# Patient Record
Sex: Male | Born: 1985 | Race: Black or African American | Hispanic: No | Marital: Single | Smoking: Former smoker
Health system: Southern US, Community
[De-identification: ages and names within clinical notes are randomized; demographics above are authoritative.]

## PROBLEM LIST (undated history)

## (undated) DIAGNOSIS — I10 Essential (primary) hypertension: Secondary | ICD-10-CM

## (undated) DIAGNOSIS — T7840XA Allergy, unspecified, initial encounter: Secondary | ICD-10-CM

## (undated) DIAGNOSIS — M1990 Unspecified osteoarthritis, unspecified site: Secondary | ICD-10-CM

## (undated) DIAGNOSIS — M419 Scoliosis, unspecified: Secondary | ICD-10-CM

## (undated) HISTORY — PX: SPINE SURGERY: SHX786

## (undated) HISTORY — DX: Allergy, unspecified, initial encounter: T78.40XA

## (undated) HISTORY — DX: Unspecified osteoarthritis, unspecified site: M19.90

## (undated) HISTORY — DX: Scoliosis, unspecified: M41.9

---
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
## 2007-07-14 ENCOUNTER — Encounter: Admission: RE | Admit: 2007-07-14 | Discharge: 2007-07-14 | Payer: Self-pay | Admitting: Internal Medicine
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
## 2011-08-27 DIAGNOSIS — M419 Scoliosis, unspecified: Secondary | ICD-10-CM

## 2011-08-27 HISTORY — DX: Scoliosis, unspecified: M41.9

## 2012-07-26 ENCOUNTER — Ambulatory Visit (INDEPENDENT_AMBULATORY_CARE_PROVIDER_SITE_OTHER): Payer: BC Managed Care – PPO | Admitting: Emergency Medicine

## 2012-07-26 VITALS — BP 125/81 | HR 76 | Temp 99.1°F | Resp 16 | Ht 73.0 in | Wt 187.6 lb

## 2012-07-26 DIAGNOSIS — R05 Cough: Secondary | ICD-10-CM

## 2012-07-26 DIAGNOSIS — J111 Influenza due to unidentified influenza virus with other respiratory manifestations: Secondary | ICD-10-CM

## 2012-07-26 LAB — POCT INFLUENZA A/B: Influenza B, POC: NEGATIVE

## 2012-07-26 MED ORDER — OSELTAMIVIR PHOSPHATE 75 MG PO CAPS: 75.0000 mg | ORAL_CAPSULE | Freq: Two times a day (BID) | ORAL | Status: DC

## 2012-07-26 NOTE — Progress Notes (Signed)
[facility] [address] [place] [address] [phone]  Date:  07/26/2012   Name:  Adam Bryant   DOB:  09-24-1985   MRN:  [id number]  PCP:  No primary provider on file.    Chief Complaint: Headache, bodyache, Chills and Fever   History of Present Illness:  Adam Bryant is a 26 y.o. very pleasant male patient who presents with the following:  Sudden onset of malaise, myalgias, fever and chills, nasal congestion and discharge.  Some nonproductive cough. No wheezing or shortness of breath.  No rash, nausea or vomiting.  No stool change.  No improvement with OTC medication.  There is no problem list on file for this patient.   Past Medical History  Diagnosis Date  . Allergy   . Arthritis     Past Surgical History  Procedure Date  . Spine surgery     History  Substance Use Topics  . Smoking status: Current Some Day Smoker  . Smokeless tobacco: Not on file  . Alcohol Use: Yes     Comment: socially    No family history on file.  No Known Allergies  Medication list has been reviewed and updated.  No current outpatient prescriptions on file prior to visit.    Review of Systems:  As per HPI, otherwise negative.    Physical Examination: Filed Vitals:   07/26/12 1401  BP: 125/81  Pulse: 76  Temp: 99.1 F (37.3 C)  Resp: 16   Filed Vitals:   07/26/12 1401  Height: 6\' 1"  (1.854 m)  Weight: 187 lb 9.6 oz (85.095 kg)   Body mass index is 24.75 kg/(m^2). Ideal Body Weight: Weight in (lb) to have BMI = 25: 189.1   GEN: WDWN, moderate distress, Non-toxic, A & O x 3  No rash or shortness of breath HEENT: Atraumatic, Normocephalic. Neck supple. No masses, No LAD.  Oropharynx negative Ears and Nose: No external deformity.  TM negative CV: RRR, No M/G/R. No JVD. No thrill. No extra heart sounds. PULM: CTA B, no wheezes, crackles, rhonchi. No retractions. No resp. distress. No accessory muscle use. ABD: S, NT, ND, +BS. No rebound. No  HSM. EXTR: No c/c/e NEURO Normal gait.  PSYCH: Normally interactive. Conversant. Not depressed or anxious appearing.  Calm demeanor.    Assessment and Plan: Influenza tamiflu  Carmelina Dane, MD  Results for orders placed in visit on 07/26/12  POCT INFLUENZA A/B      Component Value Range   Influenza A, POC Negative     Influenza B, POC Negative

## 2012-08-03 NOTE — Progress Notes (Signed)
Reviewed and agree.

## 2012-10-20 ENCOUNTER — Ambulatory Visit (INDEPENDENT_AMBULATORY_CARE_PROVIDER_SITE_OTHER): Payer: BC Managed Care – PPO | Admitting: Emergency Medicine

## 2012-10-20 VITALS — BP 136/76 | HR 63 | Temp 98.5°F | Resp 16 | Ht 73.0 in | Wt 186.0 lb

## 2012-10-20 DIAGNOSIS — S335XXA Sprain of ligaments of lumbar spine, initial encounter: Secondary | ICD-10-CM

## 2012-10-20 MED ORDER — NAPROXEN SODIUM 550 MG PO TABS: 550.0000 mg | ORAL_TABLET | Freq: Two times a day (BID) | ORAL | Status: DC

## 2012-10-20 MED ORDER — CYCLOBENZAPRINE HCL 10 MG PO TABS: 10.0000 mg | ORAL_TABLET | Freq: Three times a day (TID) | ORAL | Status: DC | PRN

## 2012-10-20 NOTE — Progress Notes (Signed)
[facility] [address], [place] [address] [phone]  Date:  10/20/2012   Name:  Adam Bryant   DOB:  [birth year]   MRN:  [id number]  PCP:  No primary provider on file.    Chief Complaint: Back Pain   History of Present Illness:  Adam Bryant is a 27 y.o. very pleasant male patient who presents with the following:  History of scoliosis and was off work for one year to "improve his back".  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  Non radiating and no neuro complaints.  No acute injury.  There is no problem list on file for this patient.   Past Medical History  Diagnosis Date  . Allergy   . Arthritis   . Scoliosis 2013    Past Surgical History  Procedure Laterality Date  . Spine surgery      History  Substance Use Topics  . Smoking status: Current Some Day Smoker  . Smokeless tobacco: Not on file  . Alcohol Use: Yes     Comment: socially    History reviewed. No pertinent family history.  No Known Allergies  Medication list has been reviewed and updated.  Current Outpatient Prescriptions on File Prior to Visit  Medication Sig Dispense Refill  . oseltamivir (TAMIFLU) 75 MG capsule Take 1 capsule (75 mg total) by mouth 2 (two) times daily.  10 capsule  0   No current facility-administered medications on file prior to visit.    Review of Systems:  As per HPI, otherwise negative.    Physical Examination: Filed Vitals:   10/20/12 1946  BP: 136/76  Pulse: 63  Temp: 98.5 F (36.9 C)  Resp: 16   Filed Vitals:   10/20/12 1946  Height: 6\' 1"  (1.854 m)  Weight: 186 lb (84.369 kg)   Body mass index is 24.55 kg/(m^2). Ideal Body Weight: Weight in (lb) to have BMI = 25: 189.1   GEN: WDWN, NAD, Non-toxic, Alert & Oriented x 3 HEENT: Atraumatic, Normocephalic.  Ears and Nose: No external deformity. EXTR: No  clubbing/cyanosis/edema NEURO: Normal gait.  PSYCH: Normally interactive. Conversant. Not depressed or anxious appearing.  Calm demeanor.  BACK:  Tender lumbar para spinous muscles.  Neuro grossly intact  Assessment and Plan: Lumbar strain Anaprox Flexeril PT consult  Carmelina Dane, MD

## 2012-10-20 NOTE — Patient Instructions (Addendum)
Back Pain, Adult Low back pain is very common. About 1 in 5 people have back pain.The cause of low back pain is rarely dangerous. The pain often gets better over time.About half of people with a sudden onset of back pain feel better in just 2 weeks. About 8 in 10 people feel better by 6 weeks.  CAUSES Some common causes of back pain include:  Strain of the muscles or ligaments supporting the spine.  Wear and tear (degeneration) of the spinal discs.  Arthritis.  Direct injury to the back. DIAGNOSIS Most of the time, the direct cause of low back pain is not known.However, back pain can be treated effectively even when the exact cause of the pain is unknown.Answering your caregiver's questions about your overall health and symptoms is one of the most accurate ways to make sure the cause of your pain is not dangerous. If your caregiver needs more information, he or she may order lab work or imaging tests (X-rays or MRIs).However, even if imaging tests show changes in your back, this usually does not require surgery. HOME CARE INSTRUCTIONS For many people, back pain returns.Since low back pain is rarely dangerous, it is often a condition that people can learn to manageon their own.   Remain active. It is stressful on the back to sit or stand in one place. Do not sit, drive, or stand in one place for more than 30 minutes at a time. Take short walks on level surfaces as soon as pain allows.Try to increase the length of time you walk each day.  Do not stay in bed.Resting more than 1 or 2 days can delay your recovery.  Do not avoid exercise or work.Your body is made to move.It is not dangerous to be active, even though your back may hurt.Your back will likely heal faster if you return to being active before your pain is gone.  Pay attention to your body when you bend and lift. Many people have less discomfortwhen lifting if they bend their knees, keep the load close to their bodies,and  avoid twisting. Often, the most comfortable positions are those that put less stress on your recovering back.  Find a comfortable position to sleep. Use a firm mattress and lie on your side with your knees slightly bent. If you lie on your back, put a pillow under your knees.  Only take over-the-counter or prescription medicines as directed by your caregiver. Over-the-counter medicines to reduce pain and inflammation are often the most helpful.Your caregiver may prescribe muscle relaxant drugs.These medicines help dull your pain so you can more quickly return to your normal activities and healthy exercise.  Put ice on the injured area.  Put ice in a plastic bag.  Place a towel between your skin and the bag.  Leave the ice on for 15 to 20 minutes, 3 to 4 times a day for the first 2 to 3 days. After that, ice and heat may be alternated to reduce pain and spasms.  Ask your caregiver about trying back exercises and gentle massage. This may be of some benefit.  Avoid feeling anxious or stressed.Stress increases muscle tension and can worsen back pain.It is important to recognize when you are anxious or stressed and learn ways to manage it.Exercise is a great option. SEEK MEDICAL CARE IF:  You have pain that is not relieved with rest or medicine.  You have pain that does not improve in 1 week.  You have new symptoms.  You are generally   not feeling well. SEEK IMMEDIATE MEDICAL CARE IF:   You have pain that radiates from your back into your legs.  You develop new bowel or bladder control problems.  You have unusual weakness or numbness in your arms or legs.  You develop nausea or vomiting.  You develop abdominal pain.  You feel faint. Document Released: 08/12/2005 Document Revised: 02/11/2012 Document Reviewed: 12/31/2010 [employer] Patient Information 2013 [employer].  

## 2013-05-05 ENCOUNTER — Encounter (HOSPITAL_COMMUNITY): Payer: Self-pay | Admitting: Emergency Medicine

## 2013-05-05 ENCOUNTER — Emergency Department (HOSPITAL_COMMUNITY)
Admission: EM | Admit: 2013-05-05 | Discharge: 2013-05-05 | Disposition: A | Discharge status: Home / Self Care | Payer: BC Managed Care – PPO | Attending: Emergency Medicine | Admitting: Emergency Medicine

## 2013-05-05 ENCOUNTER — Emergency Department (HOSPITAL_COMMUNITY): Payer: BC Managed Care – PPO

## 2013-05-05 DIAGNOSIS — K760 Fatty (change of) liver, not elsewhere classified: Secondary | ICD-10-CM

## 2013-05-05 DIAGNOSIS — R198 Other specified symptoms and signs involving the digestive system and abdomen: Secondary | ICD-10-CM | POA: Insufficient documentation

## 2013-05-05 DIAGNOSIS — K7689 Other specified diseases of liver: Secondary | ICD-10-CM | POA: Insufficient documentation

## 2013-05-05 DIAGNOSIS — K6289 Other specified diseases of anus and rectum: Secondary | ICD-10-CM | POA: Insufficient documentation

## 2013-05-05 DIAGNOSIS — F17200 Nicotine dependence, unspecified, uncomplicated: Secondary | ICD-10-CM | POA: Insufficient documentation

## 2013-05-05 DIAGNOSIS — Z8739 Personal history of other diseases of the musculoskeletal system and connective tissue: Secondary | ICD-10-CM | POA: Insufficient documentation

## 2013-05-05 DIAGNOSIS — K5289 Other specified noninfective gastroenteritis and colitis: Secondary | ICD-10-CM | POA: Insufficient documentation

## 2013-05-05 DIAGNOSIS — K529 Noninfective gastroenteritis and colitis, unspecified: Secondary | ICD-10-CM

## 2013-05-05 LAB — CBC WITH DIFFERENTIAL/PLATELET
Basophils Absolute: 0 10*3/uL (ref 0.0–0.1)
Eosinophils Absolute: 0.1 10*3/uL (ref 0.0–0.7)
Eosinophils Relative: 2 % (ref 0–5)
Lymphocytes Relative: 26 % (ref 12–46)
Lymphs Abs: 1.6 10*3/uL (ref 0.7–4.0)
MCH: 32.2 pg (ref 26.0–34.0)
MCHC: 35.6 g/dL (ref 30.0–36.0)
Monocytes Absolute: 0.7 10*3/uL (ref 0.1–1.0)
Monocytes Relative: 12 % (ref 3–12)
Neutro Abs: 3.6 10*3/uL (ref 1.7–7.7)
Neutrophils Relative %: 59 % (ref 43–77)
Platelets: 267 10*3/uL (ref 150–400)

## 2013-05-05 LAB — COMPREHENSIVE METABOLIC PANEL
ALT: 33 U/L (ref 0–53)
AST: 23 U/L (ref 0–37)
Calcium: 9.4 mg/dL (ref 8.4–10.5)
Creatinine, Ser: 0.92 mg/dL (ref 0.50–1.35)
GFR calc Af Amer: >90 mL/min (ref >90)
GFR calc non Af Amer: >90 mL/min (ref >90)
Glucose, Bld: 88 mg/dL (ref 70–99)
Sodium: 134 mEq/L — ABNORMAL LOW (ref 135–145)
Total Protein: 7.5 g/dL (ref 6.0–8.3)

## 2013-05-05 LAB — URINALYSIS, ROUTINE W REFLEX MICROSCOPIC
Bilirubin Urine: NEGATIVE
Leukocytes, UA: NEGATIVE
Specific Gravity, Urine: 1.026 (ref 1.005–1.030)

## 2013-05-05 LAB — LIPASE, BLOOD: Lipase: 17 U/L (ref 11–59)

## 2013-05-05 MED ORDER — IOHEXOL 300 MG/ML  SOLN
25.0000 mL | INTRAMUSCULAR | Status: AC
  Administered 2013-05-05: 25 mL via ORAL

## 2013-05-05 MED ORDER — IOHEXOL 300 MG/ML  SOLN
100.0000 mL | Freq: Once | INTRAMUSCULAR | Status: AC | PRN
  Administered 2013-05-05: 100 mL via INTRAVENOUS

## 2013-05-05 MED ORDER — METRONIDAZOLE 500 MG PO TABS: 500.0000 mg | ORAL_TABLET | Freq: Three times a day (TID) | ORAL | Status: DC

## 2013-05-05 MED ORDER — CIPROFLOXACIN HCL 500 MG PO TABS: 500.0000 mg | ORAL_TABLET | Freq: Two times a day (BID) | ORAL | Status: DC

## 2013-05-05 NOTE — ED Provider Notes (Signed)
CSN: [id number]     Arrival date & time 05/05/13  4098 History   None    Chief Complaint  Patient presents with  . Abdominal Pain    HPI  Adam Bryant is a 27 y.o. male with a PMH of scoliosis, arthritis, and allergies who presents to the ED for evaluation of abdominal pain.  History was provided by the patient.  Patient states that yesterday he developed sudden onset of lower abdominal pain radiating to his rectum.  He states it was a sharp shooting pain.  He states that it lasted a few minutes but resolved.   Since then he has had a dull aching lower middle abdominal pain with radiation around his lower waist bilaterally.  He continues to have intermittent sharp rectal pain today which is exacerbated by having bowel movements.  Nothing improves his pain.  Patient denies any rectal trauma. He states that he was able to have a small soft green bowel movement last night and this morning, but this is small in volume from what he usually has.  He denies any rectal bleeding or hematochezia.  He denies any nausea, emesis, or dysuria.  He denies any fever, chills, change in appetite/activity, rhinorrhea, congestion, sore throat, chest pain, SOB, weakness, or leg edema.  No hx of prior abdominal surgeries in the past.     Past Medical History  Diagnosis Date  . Allergy   . Arthritis   . Scoliosis 2013   Past Surgical History  Procedure Laterality Date  . Spine surgery     No family history on file. History  Substance Use Topics  . Smoking status: Current Some Day Smoker  . Smokeless tobacco: Not on file  . Alcohol Use: Yes     Comment: socially    Review of Systems  Constitutional: Negative for fever, chills, activity change, appetite change and fatigue.  HENT: Negative for congestion, sore throat, rhinorrhea and neck pain.   Eyes: Negative for visual disturbance.  Respiratory: Negative for cough and shortness of breath.   Cardiovascular: Negative for chest pain and leg swelling.   Gastrointestinal: Positive for abdominal pain and rectal pain. Negative for nausea, vomiting, diarrhea, constipation, blood in stool, abdominal distention and anal bleeding.  Genitourinary: Negative for dysuria and hematuria.  Musculoskeletal: Negative for back pain and gait problem.  Skin: Negative for rash and wound.  Neurological: Negative for dizziness, weakness, light-headedness, numbness and headaches.  Psychiatric/Behavioral: Negative for confusion.    Allergies  Review of patient's allergies indicates no known allergies.  Home Medications  No current outpatient prescriptions on file. BP 138/84  Pulse 61  Temp(Src) 98 F (36.7 C) (Oral)  Resp 18  SpO2 99%  Filed Vitals:   05/05/13 1130 05/05/13 1200 05/05/13 1300 05/05/13 1500  BP: 143/87 142/91 136/82 144/86  Pulse: 52 56 56 54  Temp:      TempSrc:      Resp: 18  16 18   SpO2: 99% 99% 99% 98%     Physical Exam  Nursing note and vitals reviewed. Constitutional: He is oriented to person, place, and time. He appears well-developed and well-nourished. No distress.  HENT:  Head: Normocephalic and atraumatic.  Right Ear: External ear normal.  Left Ear: External ear normal.  Nose: Nose normal.  Mouth/Throat: Oropharynx is clear and moist. No oropharyngeal exudate.  Eyes: Conjunctivae are normal. Pupils are equal, round, and reactive to light. Right eye exhibits no discharge. Left eye exhibits no discharge.  Neck: Normal range  of motion. Neck supple.  Cardiovascular: Normal rate, regular rhythm, normal heart sounds and intact distal pulses.  Exam reveals no gallop and no friction rub.   No murmur heard. Pulmonary/Chest: Effort normal and breath sounds normal. No respiratory distress. He has no wheezes. He has no rales. He exhibits no tenderness.  Abdominal: Soft. Bowel sounds are normal. He exhibits no distension and no mass. There is no tenderness. There is no rebound and no guarding.  Genitourinary: Rectum normal and  prostate normal. Guaiac negative stool.  No external fissures or hemorrhoids.  No palpable stool in the rectal vault.  No rectal bleeding.  Prostate smooth firm  round and non-tender with no palpable masses.    Musculoskeletal: Normal range of motion. He exhibits no edema and no tenderness.  Neurological: He is alert and oriented to person, place, and time.  Skin: Skin is warm and dry. He is not diaphoretic.    ED Course  Procedures (including critical care time) Labs Review Labs Reviewed - No data to display Imaging Review No results found.  Results for orders placed during the hospital encounter of 05/05/13  CBC WITH DIFFERENTIAL      Result Value Range   WBC 6.0  4.0 - 10.5 K/uL   RBC 5.16  4.22 - 5.81 MIL/uL   Hemoglobin 16.6  13.0 - 17.0 g/dL   HCT 04.5  40.9 - 81.1 %   MCV 90.3  78.0 - 100.0 fL   MCH 32.2  26.0 - 34.0 pg   MCHC 35.6  30.0 - 36.0 g/dL   RDW 91.4  78.2 - 95.6 %   Platelets 267  150 - 400 K/uL   Neutrophils Relative % 59  43 - 77 %   Neutro Abs 3.6  1.7 - 7.7 K/uL   Lymphocytes Relative 26  12 - 46 %   Lymphs Abs 1.6  0.7 - 4.0 K/uL   Monocytes Relative 12  3 - 12 %   Monocytes Absolute 0.7  0.1 - 1.0 K/uL   Eosinophils Relative 2  0 - 5 %   Eosinophils Absolute 0.1  0.0 - 0.7 K/uL   Basophils Relative 1  0 - 1 %   Basophils Absolute 0.0  0.0 - 0.1 K/uL  COMPREHENSIVE METABOLIC PANEL      Result Value Range   Sodium 134 (*) 135 - 145 mEq/L   Potassium 3.8  3.5 - 5.1 mEq/L   Chloride 99  96 - 112 mEq/L   CO2 25  19 - 32 mEq/L   Glucose, Bld 88  70 - 99 mg/dL   BUN 10  6 - 23 mg/dL   Creatinine, Ser 2.13  0.50 - 1.35 mg/dL   Calcium 9.4  8.4 - 08.6 mg/dL   Total Protein 7.5  6.0 - 8.3 g/dL   Albumin 4.1  3.5 - 5.2 g/dL   AST 23  0 - 37 U/L   ALT 33  0 - 53 U/L   Alkaline Phosphatase 73  39 - 117 U/L   Total Bilirubin 1.1  0.3 - 1.2 mg/dL   GFR calc non Af Amer >90  >90 mL/min   GFR calc Af Amer >90  >90 mL/min  LIPASE, BLOOD      Result Value  Range   Lipase 17  11 - 59 U/L  URINALYSIS, ROUTINE W REFLEX MICROSCOPIC      Result Value Range   Color, Urine YELLOW  YELLOW   APPearance CLEAR  CLEAR  Specific Gravity, Urine 1.026  1.005 - 1.030   pH 7.5  5.0 - 8.0   Glucose, UA NEGATIVE  NEGATIVE mg/dL   Hgb urine dipstick NEGATIVE  NEGATIVE   Bilirubin Urine NEGATIVE  NEGATIVE   Ketones, ur NEGATIVE  NEGATIVE mg/dL   Protein, ur NEGATIVE  NEGATIVE mg/dL   Urobilinogen, UA 1.0  0.0 - 1.0 mg/dL   Nitrite NEGATIVE  NEGATIVE   Leukocytes, UA NEGATIVE  NEGATIVE  OCCULT BLOOD, POC DEVICE      Result Value Range   Fecal Occult Bld NEGATIVE  NEGATIVE    DG Abd Acute W/Chest (Final result)  Result time: 05/05/13 09:10:15    Final result by Rad Results In Interface (05/05/13 09:10:15)    Narrative:   *RADIOLOGY REPORT*  Clinical Data: Abdominal pain, constipation  ACUTE ABDOMEN SERIES (ABDOMEN 2 VIEW & CHEST 1 VIEW)  Comparison: None.  Findings: No active infiltrate or effusion is seen. Mediastinal contours appear normal. The heart is within normal limits in size. No bony abnormality is seen. A lower anterior cervical spine fusion plate is present.  Supine and erect views of the abdomen show a nonspecific bowel gas pattern. No obstruction is seen and no free air is noted. No opaque calculi are noted. The bones appear normal.  IMPRESSION:  1. No active lung disease. 2. No bowel obstruction. No free air.   Original Report Authenticated By: Dwyane Dee, M.D.    CT Abdomen Pelvis W Contrast (Final result)  Result time: 05/05/13 14:06:06    Final result by Rad Results In Interface (05/05/13 14:06:06)    Narrative:   *RADIOLOGY REPORT*  Clinical Data: Abdominal pain; recent diarrhea  CT ABDOMEN AND PELVIS WITH CONTRAST  Technique: Multidetector CT imaging of the abdomen and pelvis was performed following the standard protocol during bolus administration of intravenous contrast. Oral contrast was  also administered.  Contrast: OMNIPAQUE IOHEXOL 300 MG/ML SOLN  Comparison: None.  Findings: Lung bases are clear.  There is fatty change in the liver. No focal liver lesions are identified. There is no biliary duct dilatation.  Spleen, pancreas, and adrenals appear normal. Kidneys bilaterally show no mass or hydronephrosis on either side.  In the pelvis, there is wall thickening in the mid sigmoid colon region. There are no appreciable diverticula in this area. There is a small amount of loculated fluid in the mid to lower pelvis. There is no pelvic mass. Appendix appears normal.  There is no bowel obstruction. There is no free air or portal venous air. There is no adenopathy or abscess in the abdomen or pelvis. No periumbilical lesion is appreciated on this study.  Aorta is nonaneurysmal. There are no blastic or lytic bone lesions.  IMPRESSION: Sigmoid colitis without abscess. There is a small amount of loculated fluid in the pelvis near this area of colitis.  Appendix appears normal. No bowel obstruction.  There is fatty change in the liver.  Study otherwise unremarkable.   Original Report Authenticated By: Bretta Bang, M.D.     MDM   1. Colitis   2. Fatty liver     Adam Bryant is a 27 y.o. male with a PMH of scoliosis, arthritis, and allergies who presents to the ED for evaluation of abdominal pain.  CBC, CMP, abdominal x-rays, lipase, UA, and occult stool ordered to further evaluate.  Patient declined pain medications.     Rechecks  8:30 AM = Rectal exam performed at bedside with RN present.   10:00  AM = Patient asking about [employer] stating he is hungry.  Pain well controlled.   10:20 AM = Repeat abdominal exam reveals peri-umbilical tenderness.  Patient is concerned about his pain.  CT ordered to further evaluate.   3:00 PM = Patient resting comfortably.  Discharge instructions given.      Etiology of abdominal and rectal pain is  likely due to sigmoid colitis.  Etiology of colitis is unclear but includes infectious vs. Inflammatory etiology.  CT scan showed sigmoid colitis without evidence of abscess.  CT scan also showed evidence of fatty liver, which patient was informed of upon discharge.  Occult blood was negative, no leukocytosis, patient afebrile, and patient remained in no acute distress throughout his ED visit.  Pain was well controlled without intervention.  Patient was prescribed cipro and flagyl for outpatient management.  Patient was instructed personally by me to return to the ED if he experiences any fever, repeated emesis, severe/changing/worsening pain, or other concerns.  He was given GI follow-up and instructed to follow-up as soon as possible.  Patient was in agreement with discharge and plan.     Final impressions: 1. Colitis, sigmoid  2. Fatty liver     Luiz Iron PA-C   This patient was discussed with Dr. Arloa Koh, PA-C 05/05/13 2201

## 2013-05-05 NOTE — ED Notes (Signed)
CT notified pt finished drinking contrast  

## 2013-05-05 NOTE — ED Notes (Signed)
Pt presents to ED with abdominal pain and pain radiating to his rectum and unable to pass stool.Denies nausea or vomiting.

## 2013-05-05 NOTE — ED Notes (Signed)
Pt returned from CT °

## 2013-05-05 NOTE — ED Notes (Signed)
Pt comfortable with d/c and f/u instructions. Prescriptions x2. 

## 2013-05-06 NOTE — ED Provider Notes (Signed)
Medical screening examination/treatment/procedure(s) were performed by non-physician practitioner and as supervising physician I was immediately available for consultation/collaboration.  Flint Melter, MD 05/06/13 2207

## 2014-06-07 ENCOUNTER — Ambulatory Visit (INDEPENDENT_AMBULATORY_CARE_PROVIDER_SITE_OTHER): Payer: Self-pay | Admitting: Family Medicine

## 2014-06-07 VITALS — BP 146/88 | HR 51 | Temp 98.1°F | Resp 16 | Ht 73.0 in | Wt 215.0 lb

## 2014-06-07 DIAGNOSIS — S86912A Strain of unspecified muscle(s) and tendon(s) at lower leg level, left leg, initial encounter: Secondary | ICD-10-CM

## 2014-06-07 DIAGNOSIS — S86812A Strain of other muscle(s) and tendon(s) at lower leg level, left leg, initial encounter: Secondary | ICD-10-CM

## 2014-06-07 MED ORDER — PREDNISONE 20 MG PO TABS: ORAL_TABLET | ORAL | Status: DC

## 2014-06-07 NOTE — Progress Notes (Signed)
This chart was scribed for Elvina SidleKurt Texas Souter, MD by Marica OtterNusrat Rahman, ED Scribe at [facility]. This patient was seen in room Room 1 and the patient's care was started at 2:41 PM.  Patient ID: Adam Bryant MRN: [id number], DOB: 1986-07-07, 28 y.o. Date of Encounter: 06/07/2014, 2:41 PM  Primary Physician: No PCP Per Patient  Chief Complaint  Patient presents with  . Knee Pain    L knee pain especially with extension and with weight-bearing. No injury. x2 days     HPI: 28 y.o. year old male with history below presents with constant, worsening left knee pain onset yesterday afternoon following a basketball game. Pt reports his pain worsened while he was at work (pt works for [employer]) and was climbing down some ladders.   Past Medical History  Diagnosis Date  . Allergy   . Arthritis   . Scoliosis 2013     Home Meds: Prior to Admission medications   Not on File    Allergies: No Known Allergies  History   Social History  . Marital Status: Single    Spouse Name: N/A    Number of Children: N/A  . Years of Education: N/A   Occupational History  . Not on file.   Social History Main Topics  . Smoking status: Current Some Day Smoker  . Smokeless tobacco: Not on file  . Alcohol Use: 1.5 - 2.0 oz/week    3-4 drink(s) per week     Comment: socially  . Drug Use: No  . Sexual Activity: Not on file   Other Topics Concern  . Not on file   Social History Narrative  . No narrative on file     Review of Systems: Constitutional: negative for chills, fever, night sweats, weight changes, or fatigue  HEENT: negative for vision changes, hearing loss, congestion, rhinorrhea, ST, epistaxis, or sinus pressure Cardiovascular: negative for chest pain or palpitations Respiratory: negative for hemoptysis, wheezing, shortness of breath, or cough Abdominal: negative for abdominal pain, nausea, vomiting, diarrhea, or constipation Dermatological: negative for  rash Neurologic: negative for headache, dizziness, or syncope Musc: Positive for left knee pain All other systems reviewed and are otherwise negative with the exception to those above and in the HPI.   Physical Exam: Blood pressure 146/88, pulse 51, temperature 98.1 F (36.7 C), temperature source Oral, resp. rate 16, height 6\' 1"  (1.854 m), weight 215 lb (97.523 kg), SpO2 98.00%., Body mass index is 28.37 kg/(m^2). General: Well developed, well nourished, in no acute distress. Head: Normocephalic, atraumatic, eyes without discharge, sclera non-icteric, nares are without discharge. Bilateral auditory canals clear, TM's are without perforation, pearly grey and translucent with reflective cone of light bilaterally. Oral cavity moist, posterior pharynx without exudate, erythema, peritonsillar abscess, or post nasal drip.  Neck: Supple. No thyromegaly. Full ROM. No lymphadenopathy. Lungs: Clear bilaterally to auscultation without wheezes, rales, or rhonchi. Breathing is unlabored. Heart: RRR with S1 S2. No murmurs, rubs, or gallops appreciated. Abdomen: Soft, non-tender, non-distended with normoactive bowel sounds. No hepatomegaly. No rebound/guarding. No obvious abdominal masses. Msk:  Strength and tone normal for age. Mild infrapatellar tenderness. No MCL or LCL tenderness. No laxity of ligaments. No effusion. Extremities/Skin: Warm and dry. No clubbing or cyanosis. No edema. No rashes or suspicious lesions. Neuro: Alert and oriented X 3. Moves all extremities spontaneously. Gait is normal. CNII-XII grossly in tact. Psych:  Responds to questions appropriately with a normal affect.     ASSESSMENT AND PLAN:  DIAGNOSTIC STUDIES: Oxygen Saturation is 98% on RA, nl by my interpretation.    COORDINATION OF CARE: 2:48 PM-Discussed treatment plan which includes meds and resting the knee for a couple of days with pt at bedside and pt agreed to plan.   28 y.o. year old male with Knee strain, left,  initial encounter - Plan: predniSONE (DELTASONE) 20 MG tablet  No work x2 days  Signed, Elvina SidleKurt Auryn Paige, MD 06/07/2014 2:43 PM  I personally performed the services described in this documentation, which was scribed in my presence. The recorded information has been reviewed and is accurate.

## 2014-06-09 ENCOUNTER — Encounter: Payer: Self-pay | Admitting: Family Medicine

## 2016-01-21 ENCOUNTER — Emergency Department (HOSPITAL_COMMUNITY)
Admission: EM | Admit: 2016-01-21 | Discharge: 2016-01-21 | Disposition: A | Discharge status: Home / Self Care | Payer: BC Managed Care – PPO | Attending: Emergency Medicine | Admitting: Emergency Medicine

## 2016-01-21 ENCOUNTER — Encounter (HOSPITAL_COMMUNITY): Payer: Self-pay | Admitting: Emergency Medicine

## 2016-01-21 DIAGNOSIS — M1990 Unspecified osteoarthritis, unspecified site: Secondary | ICD-10-CM | POA: Insufficient documentation

## 2016-01-21 DIAGNOSIS — Y999 Unspecified external cause status: Secondary | ICD-10-CM | POA: Insufficient documentation

## 2016-01-21 DIAGNOSIS — F17200 Nicotine dependence, unspecified, uncomplicated: Secondary | ICD-10-CM | POA: Insufficient documentation

## 2016-01-21 DIAGNOSIS — S76312A Strain of muscle, fascia and tendon of the posterior muscle group at thigh level, left thigh, initial encounter: Secondary | ICD-10-CM

## 2016-01-21 DIAGNOSIS — X503XXA Overexertion from repetitive movements, initial encounter: Secondary | ICD-10-CM | POA: Insufficient documentation

## 2016-01-21 DIAGNOSIS — Y929 Unspecified place or not applicable: Secondary | ICD-10-CM | POA: Insufficient documentation

## 2016-01-21 DIAGNOSIS — Y9367 Activity, basketball: Secondary | ICD-10-CM | POA: Insufficient documentation

## 2016-01-21 MED ORDER — NAPROXEN 500 MG PO TABS: 500.0000 mg | ORAL_TABLET | Freq: Two times a day (BID) | ORAL | Status: DC

## 2016-01-21 NOTE — ED Notes (Signed)
Per pt, states he was playing basketball-states he was running with the ball and pulled his left hamstring

## 2016-01-21 NOTE — ED Provider Notes (Signed)
CSN: [id number]     Arrival date & time 01/21/16  1652 History  By signing my name below, I, Phillis HaggisGabriella Gaje, attest that this documentation has been prepared under the direction and in the presence of AvayaSamantha Dowless, PA-C. Electronically Signed: Phillis HaggisGabriella Gaje, ED Scribe. 01/21/2016. 5:40 PM.   Chief Complaint  Patient presents with  . Leg Pain   The history is provided by the patient. No language interpreter was used.  HPI Comments: Adam Bryant is a 30 y.o. male who presents to the Emergency Department complaining of left leg pain onset one hour ago. Pt states that he was playing basketball when he felt a "pop" in the left thigh. He states that it felt like a "charlie horse" but was unable to find relief with stretching his leg. He reports worsening pain with ambulation, but he is able to do so. He has not taken anything for pain PTA. He denies numbness or weakness.   Past Medical History  Diagnosis Date  . Allergy   . Arthritis   . Scoliosis 2013   Past Surgical History  Procedure Laterality Date  . Spine surgery     Family History  Problem Relation Age of Onset  . Cancer Mother   . Diabetes Mother   . Hyperlipidemia Mother    Social History  Substance Use Topics  . Smoking status: Current Some Day Smoker  . Smokeless tobacco: None  . Alcohol Use: 1.5 - 2.0 oz/week    3-4 drink(s) per week     Comment: socially    Review of Systems  Musculoskeletal: Positive for arthralgias.  Neurological: Negative for weakness and numbness.  All other systems reviewed and are negative.  Allergies  Review of patient's allergies indicates no known allergies.  Home Medications   Prior to Admission medications   Medication Sig Start Date End Date Taking? Authorizing Provider  predniSONE (DELTASONE) 20 MG tablet Two daily with food 06/07/14   Elvina SidleKurt Lauenstein, MD   BP 136/90 mmHg  Pulse 73  Temp(Src) 98.3 F (36.8 C) (Oral)  Resp 18  SpO2 100% Physical Exam   Constitutional: He is oriented to person, place, and time. He appears well-developed and well-nourished. No distress.  HENT:  Head: Normocephalic and atraumatic.  Eyes: Conjunctivae are normal. Right eye exhibits no discharge. Left eye exhibits no discharge. No scleral icterus.  Cardiovascular: Normal rate.   Pulmonary/Chest: Effort normal.  Musculoskeletal:  Tenderness along left hamstring, no muscle bulge with flexion of left hamstring, or sign of muscle tear, normal active ROM of left leg  Neurological: He is alert and oriented to person, place, and time. Coordination normal.  Skin: Skin is warm and dry. No rash noted. He is not diaphoretic. No erythema. No pallor.  Psychiatric: He has a normal mood and affect. His behavior is normal.  Nursing note and vitals reviewed.   ED Course  Procedures (including critical care time) DIAGNOSTIC STUDIES: Oxygen Saturation is 100% on RA, normal by my interpretation.    COORDINATION OF CARE: 5:36 PM-Discussed treatment plan which includes RICE techniques and ibuprofen with pt at bedside and pt agreed to plan.    Labs Review Labs Reviewed - No data to display  Imaging Review No results found. I have personally reviewed and evaluated these images and lab results as part of my medical decision-making.   EKG Interpretation None      MDM   Final diagnoses:  Hamstring strain, left, initial encounter    Patient presents with  left hamstring pain that occurred while playing basketball today. Patient is ambulatory in the ED without difficulty. He appears well, in no apparent distress. Normal ability to flex and extend knee, engaging hamstring muscle. No muscle bulging or sign of muscle tear or partial tear. There is mild tenderness to palpation over the hamstring. Pt is neurovascularly intact. Suspect this is a muscle strain. Recommend conservative therapy including ice, NSAIDs and rest. Recommend follow-up with orthopedics if symptoms do not  improve. I personally performed the services described in this documentation, which was scribed in my presence. The recorded information has been reviewed and is accurate.     Lester Kinsman [place], PA-C 01/21/16 2057  Linwood Dibbles, MD 01/22/16 [phone]

## 2016-01-21 NOTE — ED Notes (Signed)
PT DISCHARGED. INSTRUCTIONS AND PRESCRIPTION GIVEN. AAOX4. PT IN NO APPARENT DISTRESS. THE OPPORTUNITY TO ASK QUESTIONS WAS PROVIDED. 

## 2016-01-21 NOTE — Discharge Instructions (Signed)
Hamstring Strain A hamstring strain is an injury that occurs when the hamstring muscles are overstretched or overloaded. The hamstring muscles are a group of muscles at the back of the thighs. These muscles are used in straightening the hips, bending the knees, and pulling back the legs. This type of injury is often called a pulled hamstring muscle. The severity of a muscle strain is rated in degrees. First-degree strains have the least amount of muscle fiber tearing and pain. Second-degree and third-degree strains have increasingly more tearing and pain. CAUSES Hamstring strains occur when a sudden, violent force is placed on these muscles and stretches them too far. This often occurs during activities that involve running, jumping, kicking, or weight lifting. RISK FACTORS Hamstring strains are especially common in athletes. Other things that can increase your risk for this injury include:  Having low strength, endurance, or flexibility of the hamstring muscles.  Performing high-impact physical activity.  Having poor physical fitness.  Having a previous leg injury.  Having fatigued muscles.  Older age. SIGNS AND SYMPTOMS  Pain in the back of the thigh.  Bruising.  Swelling.  Muscle spasm.  Difficulty using the muscle because of pain or lack of normal function. For severe strains, you may have a popping or snapping feeling when the injury occurs. DIAGNOSIS Your health care provider will perform a physical exam and ask about your medical history.  TREATMENT Often, the best treatment for a hamstring strain is protecting, resting, icing, applying compression, and elevating the injured area. This is referred to as the PRICE method of treatment. Your health care provider may also recommend medicines to help reduce pain or inflammation. HOME CARE INSTRUCTIONS  Use the PRICE method of treatment to promote muscle healing during the first 2-3 days after your injury. The PRICE method  involves:  P--Protecting the muscle from being injured again.  R--Restricting your activity and resting the injured body part.  I--Icing your injury. To do this, put ice in a plastic bag. Place a towel between your skin and the bag. Then, apply the ice and leave it on for 20 minutes, 2-3 times per day. After the third day, switch to moist heat packs.  C--Applying compression to the injured area with an elastic bandage. Be careful not to wrap it too tightly. That may interfere with blood circulation or may increase swelling.  E--Elevating the injured body part above the level of your heart as often as you can. You can do this by putting a pillow under your thigh when you sit or lie down.  Take medicines only as directed by your health care provider.  Begin exercising or stretching as directed by your health care provider.  Do not return to full activity level until your health care provider approves.  Keep all follow-up visits as directed by your health care provider. This is important. SEEK MEDICAL CARE IF:  You have increasing pain or swelling in the injured area.  You have numbness, tingling, or a significant loss of strength in the injured area.  Your foot or your toes become cold or turn blue.   This information is not intended to replace advice given to you by your health care provider. Make sure you discuss any questions you have with your health care provider.   Document Released: 05/07/2001 Document Revised: 09/02/2014 Document Reviewed: 03/28/2014 Elsevier Interactive Patient Education 2016 [employer]  Cryotherapy Cryotherapy means treatment with cold. Ice or gel packs can be used to reduce both pain and swelling.  Ice is the most helpful within the first 24 to 48 hours after an injury or flare-up from overusing a muscle or joint. Sprains, strains, spasms, burning pain, shooting pain, and aches can all be eased with ice. Ice can also be used when recovering from surgery.  Ice is effective, has very few side effects, and is safe for most people to use. PRECAUTIONS  Ice is not a safe treatment option for people with:  Raynaud phenomenon. This is a condition affecting small blood vessels in the extremities. Exposure to cold may cause your problems to return.  Cold hypersensitivity. There are many forms of cold hypersensitivity, including:  Cold urticaria. Red, itchy hives appear on the skin when the tissues begin to warm after being iced.  Cold erythema. This is a red, itchy rash caused by exposure to cold.  Cold hemoglobinuria. Red blood cells break down when the tissues begin to warm after being iced. The hemoglobin that carry oxygen are passed into the urine because they cannot combine with blood proteins fast enough.  Numbness or altered sensitivity in the area being iced. If you have any of the following conditions, do not use ice until you have discussed cryotherapy with your caregiver:  Heart conditions, such as arrhythmia, angina, or chronic heart disease.  High blood pressure.  Healing wounds or open skin in the area being iced.  Current infections.  Rheumatoid arthritis.  Poor circulation.  Diabetes. Ice slows the blood flow in the region it is applied. This is beneficial when trying to stop inflamed tissues from spreading irritating chemicals to surrounding tissues. However, if you expose your skin to cold temperatures for too long or without the proper protection, you can damage your skin or nerves. Watch for signs of skin damage due to cold. HOME CARE INSTRUCTIONS Follow these tips to use ice and cold packs safely.  Place a dry or damp towel between the ice and skin. A damp towel will cool the skin more quickly, so you may need to shorten the time that the ice is used.  For a more rapid response, add gentle compression to the ice.  Ice for no more than 10 to 20 minutes at a time. The bonier the area you are icing, the less time it will  take to get the benefits of ice.  Check your skin after 5 minutes to make sure there are no signs of a poor response to cold or skin damage.  Rest 20 minutes or more between uses.  Once your skin is numb, you can end your treatment. You can test numbness by very lightly touching your skin. The touch should be so light that you do not see the skin dimple from the pressure of your fingertip. When using ice, most people will feel these normal sensations in this order: cold, burning, aching, and numbness.  Do not use ice on someone who cannot communicate their responses to pain, such as small children or people with dementia. HOW TO MAKE AN ICE PACK Ice packs are the most common way to use ice therapy. Other methods include ice massage, ice baths, and cryosprays. Muscle creams that cause a cold, tingly feeling do not offer the same benefits that ice offers and should not be used as a substitute unless recommended by your caregiver. To make an ice pack, do one of the following:  Place crushed ice or a bag of frozen vegetables in a sealable plastic bag. Squeeze out the excess air. Place this bag inside  another plastic bag. Slide the bag into a pillowcase or place a damp towel between your skin and the bag.  Mix 3 parts water with 1 part rubbing alcohol. Freeze the mixture in a sealable plastic bag. When you remove the mixture from the freezer, it will be slushy. Squeeze out the excess air. Place this bag inside another plastic bag. Slide the bag into a pillowcase or place a damp towel between your skin and the bag. SEEK MEDICAL CARE IF:  You develop white spots on your skin. This may give the skin a blotchy (mottled) appearance.  Your skin turns blue or pale.  Your skin becomes waxy or hard.  Your swelling gets worse. MAKE SURE YOU:   Understand these instructions.  Will watch your condition.  Will get help right away if you are not doing well or get worse.   This information is not  intended to replace advice given to you by your health care provider. Make sure you discuss any questions you have with your health care provider.   Apply ice to affected area. Take Naprosyn or ibuprofen as needed for pain. Avoid strenuous activity for at least a week, try to rest your hamstring as much as possible. Prior to resuming activity please stricture muscles. Follow up with your primary care doctor or symptoms don't improve. Return to the ED if you experience severe worsening of your pain, increased swelling of your left hamstring, redness, warmth, difficulty walking.

## 2017-07-05 ENCOUNTER — Encounter (HOSPITAL_COMMUNITY): Payer: Self-pay

## 2017-07-05 ENCOUNTER — Emergency Department (HOSPITAL_COMMUNITY)
Admission: EM | Admit: 2017-07-05 | Discharge: 2017-07-06 | Disposition: A | Discharge status: Home / Self Care | Payer: BC Managed Care – PPO | Attending: Emergency Medicine | Admitting: Emergency Medicine

## 2017-07-05 ENCOUNTER — Other Ambulatory Visit: Payer: Self-pay

## 2017-07-05 DIAGNOSIS — Z79899 Other long term (current) drug therapy: Secondary | ICD-10-CM | POA: Insufficient documentation

## 2017-07-05 DIAGNOSIS — J029 Acute pharyngitis, unspecified: Secondary | ICD-10-CM | POA: Insufficient documentation

## 2017-07-05 DIAGNOSIS — F17210 Nicotine dependence, cigarettes, uncomplicated: Secondary | ICD-10-CM | POA: Insufficient documentation

## 2017-07-05 LAB — RAPID STREP SCREEN (MED CTR MEBANE ONLY): STREPTOCOCCUS, GROUP A SCREEN (DIRECT): NEGATIVE

## 2017-07-05 MED ORDER — ACETAMINOPHEN 325 MG PO TABS: 650.0000 mg | ORAL_TABLET | Freq: Once | ORAL | Status: DC | PRN

## 2017-07-05 MED ORDER — IBUPROFEN 800 MG PO TABS
800.0000 mg | ORAL_TABLET | Freq: Once | ORAL | Status: AC
  Administered 2017-07-05: 800 mg via ORAL
  Filled 2017-07-05: qty 1

## 2017-07-05 NOTE — ED Triage Notes (Signed)
States for over one week cough, sore throat, no fever at home. Nasal congestion and fullness in head voiced.

## 2017-07-05 NOTE — ED Provider Notes (Signed)
[facility]-EMERGENCY DEPT Provider Note   CSN: [id number] Arrival date & time: 07/05/17  2204     History   Chief Complaint Chief Complaint  Patient presents with  . Sore Throat    HPI Adam Bryant is a 31 y.o. male.  HPI  This is a 31 year old male who presents with sore throat.  Patient reports 1 week ago he had upper respiratory symptoms including cough, congestion.  He states that he started to feel better and thought "I was over it."  Denies any fevers at this time.  States he started feeling poorly again on Friday.  He reports multiple sick contacts.  Mostly he reports nasal congestion and sore throat that is worse with swallowing.  He reports right-sided neck pain.  He does report headache.  He took Alka-Seltzer at home with minimal relief.  Denies any fevers but does report chills.  Denies any neck stiffness.  Past Medical History:  Diagnosis Date  . Allergy   . Arthritis   . Scoliosis 2013    There are no active problems to display for this patient.   Past Surgical History:  Procedure Laterality Date  . SPINE SURGERY         Home Medications    Prior to Admission medications   Medication Sig Start Date End Date Taking? Authorizing Provider  fluticasone (FLONASE) 50 MCG/ACT nasal spray Place 1 spray daily into both nostrils. 07/06/17   Horton, Mayer Maskerourtney F, MD  ibuprofen (ADVIL,MOTRIN) 600 MG tablet Take 1 tablet (600 mg total) every 6 (six) hours as needed by mouth. 07/06/17   Horton, Mayer Maskerourtney F, MD  naproxen (NAPROSYN) 500 MG tablet Take 1 tablet (500 mg total) by mouth 2 (two) times daily. 01/21/16   Dowless, Lelon MastSamantha Tripp, PA-C  predniSONE (DELTASONE) 20 MG tablet Two daily with food 06/07/14   Elvina SidleLauenstein, Kurt, MD  sodium chloride (OCEAN) 0.65 % SOLN nasal spray Place 1 spray as needed into both nostrils for congestion. 07/06/17   Horton, Mayer Maskerourtney F, MD    Family History Family History  Problem Relation Age of Onset  .  Cancer Mother   . Diabetes Mother   . Hyperlipidemia Mother     Social History Social History   Tobacco Use  . Smoking status: Current Some Day Smoker  . Smokeless tobacco: Never Used  Substance Use Topics  . Alcohol use: Yes    Alcohol/week: 1.5 - 2.0 oz    Types: 3 - 4 Standard drinks or equivalent per week    Comment: socially  . Drug use: No     Allergies   Patient has no known allergies.   Review of Systems Review of Systems  Constitutional: Positive for chills. Negative for fever.  HENT: Positive for congestion and sore throat. Negative for trouble swallowing.   Respiratory: Positive for cough. Negative for shortness of breath.   Cardiovascular: Negative for chest pain.  Gastrointestinal: Negative for abdominal pain, diarrhea, nausea and vomiting.  Musculoskeletal: Positive for neck pain. Negative for neck stiffness.  Skin: Negative for rash.  All other systems reviewed and are negative.    Physical Exam Updated Vital Signs BP (!) 166/99 (BP Location: Left Arm)   Pulse 66   Temp 97.8 F (36.6 C) (Oral)   Resp 18   Ht 6\' 2"  (1.88 m)   Wt 99.8 kg (220 lb)   SpO2 97%   BMI 28.25 kg/m   Physical Exam  Constitutional: He is oriented to person, place,  and time. He appears well-developed and well-nourished. He does not appear ill.  HENT:  Head: Normocephalic and atraumatic.  Right Ear: Tympanic membrane normal. No middle ear effusion.  Left Ear: Tympanic membrane normal.  No middle ear effusion.  Mouth/Throat: Uvula is midline and mucous membranes are normal. No oropharyngeal exudate. Tonsils are 1+ on the right. Tonsils are 1+ on the left. No tonsillar exudate.  Uvula midline, no significant tonsillar swelling, there are palatal petechiae  Neck: Neck supple.  Bilateral cervical adenopathy  Cardiovascular: Normal rate, regular rhythm and normal heart sounds.  No murmur heard. Pulmonary/Chest: Effort normal and breath sounds normal. No respiratory distress.  He has no wheezes.  Abdominal: Soft. There is no tenderness.  Musculoskeletal: He exhibits no edema.  Lymphadenopathy:    He has cervical adenopathy.  Neurological: He is alert and oriented to person, place, and time.  Skin: Skin is warm and dry.  Psychiatric: He has a normal mood and affect.  Nursing note and vitals reviewed.    ED Treatments / Results  Labs (all labs ordered are listed, but only abnormal results are displayed) Labs Reviewed  RAPID STRE[facility]CREEN [facility])  CULTURE, GROUP A STREP [facility])    EKG  EKG Interpretation None       Radiology No results found.  Procedures Procedures (including critical care time)  Medications Ordered in ED Medications  acetaminophen (TYLENOL) tablet 650 mg (not administered)  ibuprofen (ADVIL,MOTRIN) tablet 800 mg (800 mg Oral Given 07/05/17 2335)     Initial Impression / Assessment and Plan / ED Course  I have reviewed the triage vital signs and the nursing notes.  Pertinent labs & imaging results that were available during my care of the patient were reviewed by me and considered in my medical decision making (see chart for details).     Patient presents with sore throat.  Recent upper respiratory symptoms.  He is nontoxic-appearing.  Afebrile.  No suspicion on clinical exam of deep space infection.  He does have some palatal petechiae and some cervical adenopathy.  Strep screen is negative.  Recommend supportive measures including nasal saline, Flonase, and ibuprofen as needed for discomfort.  Warm salt water gargles recommended as well.  After history, exam, and medical workup I feel the patient has been appropriately medically screened and is safe for discharge home. Pertinent diagnoses were discussed with the patient. Patient was given return precautions.   Final Clinical Impressions(s) / ED Diagnoses   Final diagnoses:  Viral pharyngitis    ED Discharge Orders        Ordered    ibuprofen (ADVIL,MOTRIN) 600  MG tablet  Every 6 hours PRN     07/06/17 0001    fluticasone (FLONASE) 50 MCG/ACT nasal spray  Daily     07/06/17 0001    sodium chloride (OCEAN) 0.65 % SOLN nasal spray  As needed     07/06/17 0001       Horton, Mayer Maskerourtney F, MD 07/06/17 0007

## 2017-07-06 MED ORDER — FLUTICASONE PROPIONATE 50 MCG/ACT NA SUSP: 1.0000 | Freq: Every day | NASAL | 2 refills | Status: AC

## 2017-07-06 MED ORDER — SALINE SPRAY 0.65 % NA SOLN: 1.0000 | NASAL | 0 refills | Status: AC | PRN

## 2017-07-06 MED ORDER — IBUPROFEN 600 MG PO TABS
600.0000 mg | ORAL_TABLET | Freq: Four times a day (QID) | ORAL | 0 refills | Status: DC | PRN
Start: 2017-07-06 — End: 2020-06-26

## 2017-07-06 NOTE — Discharge Instructions (Signed)
Your strep screen is negative.  This likely viral.  Take ibuprofen as needed for pain.  Nasal saline and nasal steroid to clear your nose out.

## 2017-07-08 LAB — CULTURE, GROUP A STREP (THRC)

## 2018-10-13 ENCOUNTER — Other Ambulatory Visit: Payer: Self-pay | Admitting: Neurosurgery

## 2018-10-13 DIAGNOSIS — M542 Cervicalgia: Secondary | ICD-10-CM

## 2018-10-22 ENCOUNTER — Ambulatory Visit
Admission: RE | Admit: 2018-10-22 | Discharge: 2018-10-22 | Disposition: A | Discharge status: Home / Self Care | Payer: 59 | Source: Ambulatory Visit | Attending: Neurosurgery | Admitting: Neurosurgery

## 2018-10-22 DIAGNOSIS — M542 Cervicalgia: Secondary | ICD-10-CM

## 2020-06-26 ENCOUNTER — Encounter (HOSPITAL_COMMUNITY): Payer: Self-pay

## 2020-06-26 ENCOUNTER — Other Ambulatory Visit: Payer: Self-pay

## 2020-06-26 ENCOUNTER — Emergency Department (HOSPITAL_COMMUNITY): Payer: 59

## 2020-06-26 ENCOUNTER — Emergency Department (HOSPITAL_COMMUNITY)
Admission: EM | Admit: 2020-06-26 | Discharge: 2020-06-26 | Disposition: A | Discharge status: Home / Self Care | Payer: 59 | Attending: Emergency Medicine | Admitting: Emergency Medicine

## 2020-06-26 DIAGNOSIS — M25522 Pain in left elbow: Secondary | ICD-10-CM | POA: Insufficient documentation

## 2020-06-26 DIAGNOSIS — G5622 Lesion of ulnar nerve, left upper limb: Secondary | ICD-10-CM

## 2020-06-26 DIAGNOSIS — F17210 Nicotine dependence, cigarettes, uncomplicated: Secondary | ICD-10-CM | POA: Insufficient documentation

## 2020-06-26 MED ORDER — OMEPRAZOLE 20 MG PO CPDR: 20.0000 mg | DELAYED_RELEASE_CAPSULE | Freq: Every day | ORAL | 0 refills | Status: AC

## 2020-06-26 MED ORDER — MELOXICAM 7.5 MG PO TABS: 7.5000 mg | ORAL_TABLET | Freq: Every day | ORAL | 0 refills | Status: AC

## 2020-06-26 NOTE — ED Provider Notes (Signed)
[facility]-EMERGENCY DEPT Provider Note   CSN: [id number] Arrival date & time: 06/26/20  6503     History Chief Complaint  Patient presents with  . Elbow Pain    Adam Bryant is a 34 y.o. male.  34 year old male presents with complaint of left elbow pain x 6 months, progressively worsening. Patient reports rolling over in bed and applying pressure to his left elbow which made pain significantly worse.  Pain is worse with flexion of the elbow, radiates up and down his left arm.  Pain improved with extension of the elbow.  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  States that he works from his computer at home and often rests on his hand with a flexed elbow with pressure on the desk.  Denies any swelling, redness, warmth to the area.  No other injuries, complaints, concerns.        Past Medical History:  Diagnosis Date  . Allergy   . Arthritis   . Scoliosis 2013    There are no problems to display for this patient.   Past Surgical History:  Procedure Laterality Date  . SPINE SURGERY         Family History  Problem Relation Age of Onset  . Cancer Mother   . Diabetes Mother   . Hyperlipidemia Mother     Social History   Tobacco Use  . Smoking status: Current Some Day Smoker    Packs/day: 0.15    Types: Cigarettes  . Smokeless tobacco: Never Used  Vaping Use  . Vaping Use: Never used  Substance Use Topics  . Alcohol use: Yes    Alcohol/week: 3.0 - 4.0 standard drinks    Types: 3 - 4 Standard drinks or equivalent per week    Comment: socially  . Drug use: No    Home Medications Prior to Admission medications   Medication Sig Start Date End Date Taking? Authorizing Provider  fluticasone (FLONASE) 50 MCG/ACT nasal spray Place 1 spray daily into both nostrils. 07/06/17   Horton, Mayer Masker, MD  meloxicam (MOBIC) 7.5 MG tablet Take 1 tablet (7.5 mg total) by  mouth daily. 06/26/20   Jeannie Fend, PA-C  omeprazole (PRILOSEC) 20 MG capsule Take 1 capsule (20 mg total) by mouth daily. 06/26/20   Jeannie Fend, PA-C  sodium chloride (OCEAN) 0.65 % SOLN nasal spray Place 1 spray as needed into both nostrils for congestion. 07/06/17   Horton, Mayer Masker, MD    Allergies    Patient has no allergy information on record.  Review of Systems   Review of Systems  Constitutional: Negative for fever.  Musculoskeletal: Positive for arthralgias. Negative for joint swelling, neck pain and neck stiffness.  Skin: Negative for color change, rash and wound.  Allergic/Immunologic: Negative for immunocompromised state.  Neurological: Negative for weakness and numbness.    Physical Exam Updated Vital Signs BP (!) 163/106   Pulse 70   Temp 98 F (36.7 C) (Oral)   Resp 16   Ht 6\' 2"  (1.88 m)   Wt 111.1 kg   SpO2 100%   BMI 31.46 kg/m   Physical Exam Cardiovascular:     Pulses: Normal pulses.  Musculoskeletal:        General: Tenderness present. No swelling or deformity.     Comments: Tenderness just posterior to the left olecranon process. Normal ROM left shoulder  and wrist. Pain with flexion of the left elbow.   Skin:    General: Skin is warm and dry.     Findings: No erythema or rash.  Neurological:     Sensory: No sensory deficit.     Motor: No weakness.     ED Results / Procedures / Treatments   Labs (all labs ordered are listed, but only abnormal results are displayed) Labs Reviewed - No data to display  EKG None  Radiology DG Elbow Complete Left  Result Date: 06/26/2020 CLINICAL DATA:  Pain on left elbow since last night; no trauma; pt states that he rolled onto his left elbow and excruciating pain started; previous injury to the elbow years ago; left elbow pain EXAM: LEFT ELBOW - COMPLETE 3+ VIEW COMPARISON:  None. FINDINGS: No evidence of fracture of the ulna or humerus. The radial head is normal. No joint effusion. IMPRESSION: No  fracture or dislocation. Electronically Signed   By: Genevive Bi M.D.   On: 06/26/2020 09:23    Procedures Procedures (including critical care time)  Medications Ordered in ED Medications - No data to display  ED Course  I have reviewed the triage vital signs and the nursing notes.  Pertinent labs & imaging results that were available during my care of the patient were reviewed by me and considered in my medical decision making (see chart for details).  Clinical Course as of Jun 27 1015  Mon Jun 26, 2020  7511 34 year old male with left elbow pain without injury. XR unremarkable. Doubt bursitis, no swelling of the bursa. No erythema, doubt septic joint. Tenderness along posterior aspect of the olecranon process, possible contusion vs ulnar nerve irritation. Recommend course of NSAIDs (added prilosec due to report of stomach irritation with NSAIDs), refer to ortho if not improving.    [LM]    Clinical Course User Index [LM] Alden Hipp   MDM Rules/Calculators/A&P                          Final Clinical Impression(s) / ED Diagnoses Final diagnoses:  Left elbow pain  Ulnar nerve compression, left    Rx / DC Orders ED Discharge Orders         Ordered    omeprazole (PRILOSEC) 20 MG capsule  Daily        06/26/20 0931    meloxicam (MOBIC) 7.5 MG tablet  Daily        06/26/20 0931           Jeannie Fend, PA-C 06/26/20 1016    Linwood Dibbles, MD 06/27/20 0830

## 2020-06-26 NOTE — Discharge Instructions (Addendum)
Your x-ray today is normal. Your pain maybe from inflammation along your ulnar nerve. Recommend Meloxicam daily with food. Take Prilosec daily to help protect your stomach.  Avoid prolonged bending of your elbow as discussed. Follow up with orthopedics if not improving.

## 2020-06-26 NOTE — ED Triage Notes (Signed)
Patient reports pain in his left elbow since last night. Patient reports a previous injury with the left elbow when in high school, but states last night he just rolled over while in bed last night and had an excrutiating pain. paitent states pain worse with movement.

## 2020-06-26 NOTE — ED Notes (Signed)
Discharge paperwork reviewed with pt, including prescriptions.  Pt with no questions at this time. Ambulatory at time of discharge.

## 2023-02-05 ENCOUNTER — Other Ambulatory Visit: Payer: Self-pay

## 2023-02-05 ENCOUNTER — Encounter (HOSPITAL_COMMUNITY): Payer: Self-pay

## 2023-02-05 ENCOUNTER — Emergency Department (HOSPITAL_COMMUNITY)
Admission: EM | Admit: 2023-02-05 | Discharge: 2023-02-05 | Disposition: A | Discharge status: Home / Self Care | Payer: No Typology Code available for payment source | Attending: Emergency Medicine | Admitting: Emergency Medicine

## 2023-02-05 DIAGNOSIS — M5442 Lumbago with sciatica, left side: Secondary | ICD-10-CM | POA: Insufficient documentation

## 2023-02-05 DIAGNOSIS — I10 Essential (primary) hypertension: Secondary | ICD-10-CM | POA: Diagnosis not present

## 2023-02-05 DIAGNOSIS — M545 Low back pain, unspecified: Secondary | ICD-10-CM | POA: Diagnosis present

## 2023-02-05 MED ORDER — KETOROLAC TROMETHAMINE 15 MG/ML IJ SOLN
15.0000 mg | Freq: Once | INTRAMUSCULAR | Status: AC
  Administered 2023-02-05: 15 mg via INTRAMUSCULAR
  Filled 2023-02-05: qty 1

## 2023-02-05 MED ORDER — DIAZEPAM 5 MG PO TABS
5.0000 mg | ORAL_TABLET | Freq: Once | ORAL | Status: AC
  Administered 2023-02-05: 5 mg via ORAL
  Filled 2023-02-05: qty 1

## 2023-02-05 MED ORDER — ACETAMINOPHEN 500 MG PO TABS
1000.0000 mg | ORAL_TABLET | Freq: Once | ORAL | Status: AC
  Administered 2023-02-05: 1000 mg via ORAL
  Filled 2023-02-05: qty 2

## 2023-02-05 MED ORDER — OXYCODONE HCL 5 MG PO TABS
5.0000 mg | ORAL_TABLET | Freq: Once | ORAL | Status: AC
  Administered 2023-02-05: 5 mg via ORAL
  Filled 2023-02-05: qty 1

## 2023-02-05 MED ORDER — AMLODIPINE BESYLATE 5 MG PO TABS: 5.0000 mg | ORAL_TABLET | Freq: Every day | ORAL | 3 refills | Status: AC

## 2023-02-05 NOTE — ED Triage Notes (Signed)
Pt coming in complaining of a flare up of chronic lower back pain. Pt states he is unable to tolerate the pain at this time.

## 2023-02-05 NOTE — Discharge Instructions (Signed)
Your back pain is most likely due to a muscular strain.  There is been a lot of research on back pain, unfortunately the only thing that seems to really help is Tylenol and ibuprofen.  Relative rest is also important to not lift greater than 10 pounds bending or twisting at the waist.  Please follow-up with your family physician.  The other thing that really seems to benefit patients is physical therapy which your doctor may send you for.  Please return to the emergency department for new numbness or weakness to your arms or legs. Difficulty with urinating or urinating or pooping on yourself.  Also if you cannot feel toilet paper when you wipe or get a fever.   Take 4 over the counter ibuprofen tablets 3 times a day or 2 over-the-counter naproxen tablets twice a day for pain. Also take tylenol 1000mg (2 extra strength) four times a day.   Stretches can also work.  Try [email or web address]

## 2023-02-05 NOTE — ED Provider Notes (Signed)
[place] EMERGENCY DEPARTMENT AT [facility] Provider Note   CSN: [id number] Arrival date & time: 02/05/23  0736     History  Chief Complaint  Patient presents with   Back Pain    Adam Bryant is a 37 y.o. male.  37 yo M with left-sided low back pain that radiates down the leg.  This has been an ongoing issue for him.  Worsened last night and then noticed that much more severe this morning.  He has seen a chiropractor for this.  Is seeing a neurosurgeon for it.  Has not taken any medications in some time.  Denies acute trauma denies loss of bowel or bladder denies loss of rectal sensation.  He says it does take some time to initiate his stream.  He also feels that the pain radiates from his left side down into the anterior aspect of the leg.  Sometimes it feels numb.  He sleeps on the floor not uncommonly.  Sometimes feels like that makes it better.   Back Pain      Home Medications Prior to Admission medications   Medication Sig Start Date End Date Taking? Authorizing Provider  amLODipine (NORVASC) 5 MG tablet Take 1 tablet (5 mg total) by mouth daily. 02/05/23  Yes Melene Plan, DO  fluticasone (FLONASE) 50 MCG/ACT nasal spray Place 1 spray daily into both nostrils. 07/06/17   Horton, Mayer Masker, MD  meloxicam (MOBIC) 7.5 MG tablet Take 1 tablet (7.5 mg total) by mouth daily. 06/26/20   Jeannie Fend, PA-C  omeprazole (PRILOSEC) 20 MG capsule Take 1 capsule (20 mg total) by mouth daily. 06/26/20   Jeannie Fend, PA-C  sodium chloride (OCEAN) 0.65 % SOLN nasal spray Place 1 spray as needed into both nostrils for congestion. 07/06/17   Horton, Mayer Masker, MD      Allergies    Patient has no allergy information on record.    Review of Systems   Review of Systems  Musculoskeletal:  Positive for back pain.    Physical Exam Updated Vital Signs BP (!) 189/140   Pulse 66   Temp 98.1 F (36.7 C)   Resp 17   Ht 6\' 3"  (1.905 m)   Wt 117.9 kg   SpO2 96%    BMI 32.50 kg/m  Physical Exam Vitals and nursing note reviewed.  Constitutional:      Appearance: He is well-developed.  HENT:     Head: Normocephalic and atraumatic.  Eyes:     Pupils: Pupils are equal, round, and reactive to light.  Neck:     Vascular: No JVD.  Cardiovascular:     Rate and Rhythm: Normal rate and regular rhythm.     Heart sounds: No murmur heard.    No friction rub. No gallop.  Pulmonary:     Effort: No respiratory distress.     Breath sounds: No wheezing.  Abdominal:     General: There is no distension.     Tenderness: There is no abdominal tenderness. There is no guarding or rebound.  Musculoskeletal:        General: Normal range of motion.     Cervical back: Normal range of motion and neck supple.     Comments: Pulse motor and sensation intact to the left lower extremity.  Reflexes are 2+ and equal.  There is no clonus.  Negative straight leg raise test.  Skin:    Coloration: Skin is not pale.     Findings:  No rash.  Neurological:     Mental Status: He is alert and oriented to person, place, and time.  Psychiatric:        Behavior: Behavior normal.     ED Results / Procedures / Treatments   Labs (all labs ordered are listed, but only abnormal results are displayed) Labs Reviewed - No data to display  EKG None  Radiology No results found.  Procedures Procedures    Medications Ordered in ED Medications  ketorolac (TORADOL) 15 MG/ML injection 15 mg (has no administration in time range)  acetaminophen (TYLENOL) tablet 1,000 mg (has no administration in time range)  oxyCODONE (Oxy IR/ROXICODONE) immediate release tablet 5 mg (has no administration in time range)  diazepam (VALIUM) tablet 5 mg (has no administration in time range)    ED Course/ Medical Decision Making/ A&P                             Medical Decision Making Risk OTC drugs. Prescription drug management.   37 yo M with a chief complaints of left-sided low back pain  that radiates down the leg.  This problems been ongoing for years.  He has seen both neurosurgery and sees a [facility].  He does not have a primary care doctor.  He has had worsening of his pain over the past couple days which is brought him in.  I think this is unlikely to be cauda equina based on history and physical exam.  Will treat him supportively.  Encouraged him to follow-up with a primary care doctor.  Will give a referral to sports medicine.  His blood pressure was also noted to be elevated today.  He tells me every time he gets checked it is elevated.  I will start him on amlodipine per our hospital protocol.  Encouraged him to follow-up with primary care.  8:04 AM:  I have discussed the diagnosis/risks/treatment options with the patient.  Evaluation and diagnostic testing in the emergency department does not suggest an emergent condition requiring admission or immediate intervention beyond what has been performed at this time.  They will follow up with PCP, sports med, neurosurgey. We also discussed returning to the ED immediately if new or worsening sx occur. We discussed the sx which are most concerning (e.g., sudden worsening pain, fever, inability to tolerate by mouth, cauda equina s/sx) that necessitate immediate return. Medications administered to the patient during their visit and any new prescriptions provided to the patient are listed below.  Medications given during this visit Medications  ketorolac (TORADOL) 15 MG/ML injection 15 mg (has no administration in time range)  acetaminophen (TYLENOL) tablet 1,000 mg (has no administration in time range)  oxyCODONE (Oxy IR/ROXICODONE) immediate release tablet 5 mg (has no administration in time range)  diazepam (VALIUM) tablet 5 mg (has no administration in time range)     The patient appears reasonably screen and/or stabilized for discharge and I doubt any other medical condition or other [facility] requiring further screening,  evaluation, or treatment in the ED at this time prior to discharge.          Final Clinical Impression(s) / ED Diagnoses Final diagnoses:  Acute left-sided low back pain with left-sided sciatica  Uncontrolled hypertension    Rx / DC Orders ED Discharge Orders          Ordered    amLODipine (NORVASC) 5 MG tablet  Daily  02/05/23 0800              Melene Plan, DO 02/05/23 [phone]

## 2023-04-02 ENCOUNTER — Emergency Department (HOSPITAL_COMMUNITY)
Admission: EM | Admit: 2023-04-02 | Discharge: 2023-04-02 | Disposition: A | Discharge status: Home / Self Care | Payer: Self-pay | Attending: Emergency Medicine | Admitting: Emergency Medicine

## 2023-04-02 ENCOUNTER — Encounter (HOSPITAL_COMMUNITY): Payer: Self-pay

## 2023-04-02 ENCOUNTER — Emergency Department (HOSPITAL_COMMUNITY): Payer: Self-pay

## 2023-04-02 DIAGNOSIS — Z79899 Other long term (current) drug therapy: Secondary | ICD-10-CM | POA: Insufficient documentation

## 2023-04-02 DIAGNOSIS — R079 Chest pain, unspecified: Secondary | ICD-10-CM | POA: Insufficient documentation

## 2023-04-02 DIAGNOSIS — E876 Hypokalemia: Secondary | ICD-10-CM | POA: Insufficient documentation

## 2023-04-02 DIAGNOSIS — R7309 Other abnormal glucose: Secondary | ICD-10-CM

## 2023-04-02 DIAGNOSIS — R739 Hyperglycemia, unspecified: Secondary | ICD-10-CM | POA: Insufficient documentation

## 2023-04-02 DIAGNOSIS — R197 Diarrhea, unspecified: Secondary | ICD-10-CM | POA: Insufficient documentation

## 2023-04-02 DIAGNOSIS — R030 Elevated blood-pressure reading, without diagnosis of hypertension: Secondary | ICD-10-CM

## 2023-04-02 DIAGNOSIS — I10 Essential (primary) hypertension: Secondary | ICD-10-CM | POA: Insufficient documentation

## 2023-04-02 HISTORY — DX: Essential (primary) hypertension: I10

## 2023-04-02 LAB — TROPONIN I (HIGH SENSITIVITY)
Troponin I (High Sensitivity): 8 ng/L (ref <18)
Troponin I (High Sensitivity): 8 ng/L (ref <18)

## 2023-04-02 LAB — CBC WITH DIFFERENTIAL/PLATELET
Abs Immature Granulocytes: 0.02 10*3/uL (ref 0.00–0.07)
Basophils Absolute: 0 10*3/uL (ref 0.0–0.1)
Basophils Relative: 1 %
Eosinophils Absolute: 0.1 10*3/uL (ref 0.0–0.5)
Eosinophils Relative: 2 %
HCT: 47.5 % (ref 39.0–52.0)
Hemoglobin: 16 g/dL (ref 13.0–17.0)
Immature Granulocytes: 0 %
Lymphocytes Relative: 37 %
Lymphs Abs: 2.3 10*3/uL (ref 0.7–4.0)
MCH: 30.9 pg (ref 26.0–34.0)
MCHC: 33.7 g/dL (ref 30.0–36.0)
MCV: 91.9 fL (ref 80.0–100.0)
Monocytes Absolute: 0.7 10*3/uL (ref 0.1–1.0)
Monocytes Relative: 10 %
Neutro Abs: 3.1 10*3/uL (ref 1.7–7.7)
Neutrophils Relative %: 50 %
Platelets: 273 10*3/uL (ref 150–400)
RBC: 5.17 MIL/uL (ref 4.22–5.81)
RDW: 13.6 % (ref 11.5–15.5)
WBC: 6.2 10*3/uL (ref 4.0–10.5)
nRBC: 0 % (ref 0.0–0.2)

## 2023-04-02 LAB — BASIC METABOLIC PANEL
Anion gap: 12 (ref 5–15)
BUN: 13 mg/dL (ref 6–20)
CO2: 23 mmol/L (ref 22–32)
Calcium: 9.3 mg/dL (ref 8.9–10.3)
Chloride: 102 mmol/L (ref 98–111)
Creatinine, Ser: 0.89 mg/dL (ref 0.61–1.24)
GFR, Estimated: >60 mL/min (ref >60)
Glucose, Bld: 106 mg/dL — ABNORMAL HIGH (ref 70–99)
Potassium: 3.4 mmol/L — ABNORMAL LOW (ref 3.5–5.1)
Sodium: 137 mmol/L (ref 135–145)

## 2023-04-02 MED ORDER — TRIAMTERENE-HCTZ 37.5-25 MG PO TABS: 1.0000 | ORAL_TABLET | Freq: Every day | ORAL | 2 refills | Status: AC

## 2023-04-02 MED ORDER — POTASSIUM CHLORIDE CRYS ER 20 MEQ PO TBCR
40.0000 meq | EXTENDED_RELEASE_TABLET | Freq: Once | ORAL | Status: AC
  Administered 2023-04-02: 40 meq via ORAL
  Filled 2023-04-02: qty 2

## 2023-04-02 NOTE — ED Provider Notes (Signed)
[place] EMERGENCY DEPARTMENT AT [facility] Provider Note   CSN: [id number] Arrival date & time: 04/02/23  0507     History  Chief Complaint  Patient presents with   Chest Pain    Adam Bryant is a 37 y.o. male.  The history is provided by the patient.  Chest Pain He was awakened at about 2 AM stating that he felt hot and then he had some diarrhea.  At about this time, he developed a squeezing feeling in his chest without radiation.  There is no associated dyspnea, nausea, diaphoresis.  He states that he was sitting on the commode for about 1.5 hours and when he was finished, that the discomfort had gone away.  He has never had anything like this before.  He states he has been told he had high blood pressure but was never on any medication for it.  He smokes marijuana but denies any tobacco use.  He denies a history of diabetes or hyperlipidemia.  He is adopted so he does not know his family history.  He denies any recent travel.   Home Medications Prior to Admission medications   Medication Sig Start Date End Date Taking? Authorizing Provider  amLODipine (NORVASC) 5 MG tablet Take 1 tablet (5 mg total) by mouth daily. 02/05/23   Melene Plan, DO  fluticasone (FLONASE) 50 MCG/ACT nasal spray Place 1 spray daily into both nostrils. 07/06/17   Horton, Mayer Masker, MD  meloxicam (MOBIC) 7.5 MG tablet Take 1 tablet (7.5 mg total) by mouth daily. 06/26/20   Jeannie Fend, PA-C  omeprazole (PRILOSEC) 20 MG capsule Take 1 capsule (20 mg total) by mouth daily. 06/26/20   Jeannie Fend, PA-C  sodium chloride (OCEAN) 0.65 % SOLN nasal spray Place 1 spray as needed into both nostrils for congestion. 07/06/17   Horton, Mayer Masker, MD      Allergies    Patient has no known allergies.    Review of Systems   Review of Systems  Cardiovascular:  Positive for chest pain.  All other systems reviewed and are negative.   Physical Exam Updated Vital Signs BP (!) 183/112    Pulse 84   Resp (!) 24   Ht 6\' 3"  (1.905 m)   Wt 113.4 kg   SpO2 100%   BMI 31.25 kg/m  Physical Exam Vitals and nursing note reviewed.   37 year old male, resting comfortably and in no acute distress. Vital signs are significant for elevated blood pressure and slightly elevated respiratory rate. Oxygen saturation is 100%, which is normal. Head is normocephalic and atraumatic. PERRLA, EOMI. Oropharynx is clear. Neck is nontender and supple without adenopathy or JVD. Back is nontender and there is no CVA tenderness. Lungs are clear without rales, wheezes, or rhonchi. Chest is nontender. Heart has regular rate and rhythm without murmur. Abdomen is soft, flat, nontender. Extremities have no cyanosis or edema, full range of motion is present. Skin is warm and dry without rash. Neurologic: Mental status is normal, cranial nerves are intact, moves all extremities equally.  ED Results / Procedures / Treatments   Labs (all labs ordered are listed, but only abnormal results are displayed) Labs Reviewed - No data to display  EKG EKG Interpretation Date/Time:  Wednesday April 02 2023 05:16:21 EDT Ventricular Rate:  77 PR Interval:  156 QRS Duration:  112 QT Interval:  414 QTC Calculation: 469 R Axis:   -51  Text Interpretation: Sinus rhythm Incomplete RBBB  and LAFB ST elev, probable normal early repol pattern Borderline prolonged QT interval Baseline wander in lead(s) II Bryant When compared with ECG of 10/25/2003, ST elevation is less prominent, still in pattern of early repolarization Confirmed by Dione Booze ([id number]) on 04/02/2023 5:28:40 AM  Radiology No results found.  Procedures Procedures  Cardiac monitor shows normal sinus rhythm, per my interpretation.  Medications Ordered in ED Medications - No data to display  ED Course/ Medical Decision Making/ A&P                                 Medical Decision Making Amount and/or Complexity of Data Reviewed Labs:  ordered. Radiology: ordered.  Risk Prescription drug management.   Chest pain of uncertain cause.  His only risk factor for heart disease is history of hypertension.  Differential diagnosis includes GERD, pulmonary embolism (doubt pulmonary embolism as he has no risk factors for that), referred gastrointestinal pain.  Differential does include conditions with significant risk of morbidity and complications.  I have reviewed and interpreted his electrocardiogram and my interpretation is early repolarization.  He is symptom-free now.  I have ordered chest x-ray and laboratory workup of CBC, basic metabolic panel, troponin x 2.  I have reviewed his past records, and he has no relevant past visits.  I do note ED visits on 02/05/2023, 06/26/2020, 07/05/2017 where he had significantly elevated blood pressure at each visit.  I strongly suspect that he does have true hypertension and will need medication for it.  I have reviewed and interpreted his laboratory test, and my interpretation is slightly low potassium and have ordered a dose of oral potassium, borderline elevated glucose which will need to be followed as an outpatient, normal troponin, normal CBC.  Chest x-ray shows no acute process.  Have independently viewed the images, and agree with the radiologist's interpretation.  Patient's blood pressure continues to be elevated.  He will need to be held in the emergency department for repeat troponin.  I am signing him out to Dr. Anitra Lauth, but I am ordering a prescription for triamterene-hydrochlorothiazide.  I have discussed with him the importance of obtaining a primary care physician.  Final Clinical Impression(s) / ED Diagnoses Final diagnoses:  Nonspecific chest pain  Elevated blood pressure reading without diagnosis of hypertension  Elevated random blood glucose level  Hypokalemia    Rx / DC Orders ED Discharge Orders     None         Dione Booze, MD 04/02/23 [phone]

## 2023-04-02 NOTE — ED Triage Notes (Addendum)
Pt arrived POV for CP that started 2 hours ago when he woke up, reports pressure to chest and felt lightheaded, denies cardiac history, slight SOB. HTN noted, hx of same w/o medication. 194/151, 190/131

## 2023-04-02 NOTE — Discharge Instructions (Addendum)
Please obtain a blood pressure cuff and monitor your blood pressure.  You should check your blood pressure once a day and keep a record of it.  Whenever you see your physician, you should take that record of your blood pressure readings with you. Your second heart marker was normal.
